# Patient Record
Sex: Male | Born: 1990 | Race: White | Hispanic: No | Marital: Married | Smoking: Never smoker
Health system: Southern US, Community
[De-identification: ages and names within clinical notes are randomized; demographics above are authoritative.]

## PROBLEM LIST (undated history)

## (undated) HISTORY — PX: ORIF ANKLE FRACTURE: SHX5408

## (undated) HISTORY — PX: FACIAL FRACTURE SURGERY: SHX1570

## (undated) HISTORY — PX: TIBIA FRACTURE SURGERY: SHX806

---
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
## 2010-08-27 IMAGING — CR RIGHT ANKLE - COMPLETE 3+ VIEW
1 series · 5 of 5 positions shown · non-contrast
Comparison: none

REASON FOR EXAM: pain and injury
COMMENTS:   May transport without cardiac monitor

[Series 1: view not recorded · 0.17mm/px · 5 of 5 slices shown]
[im 1/5]
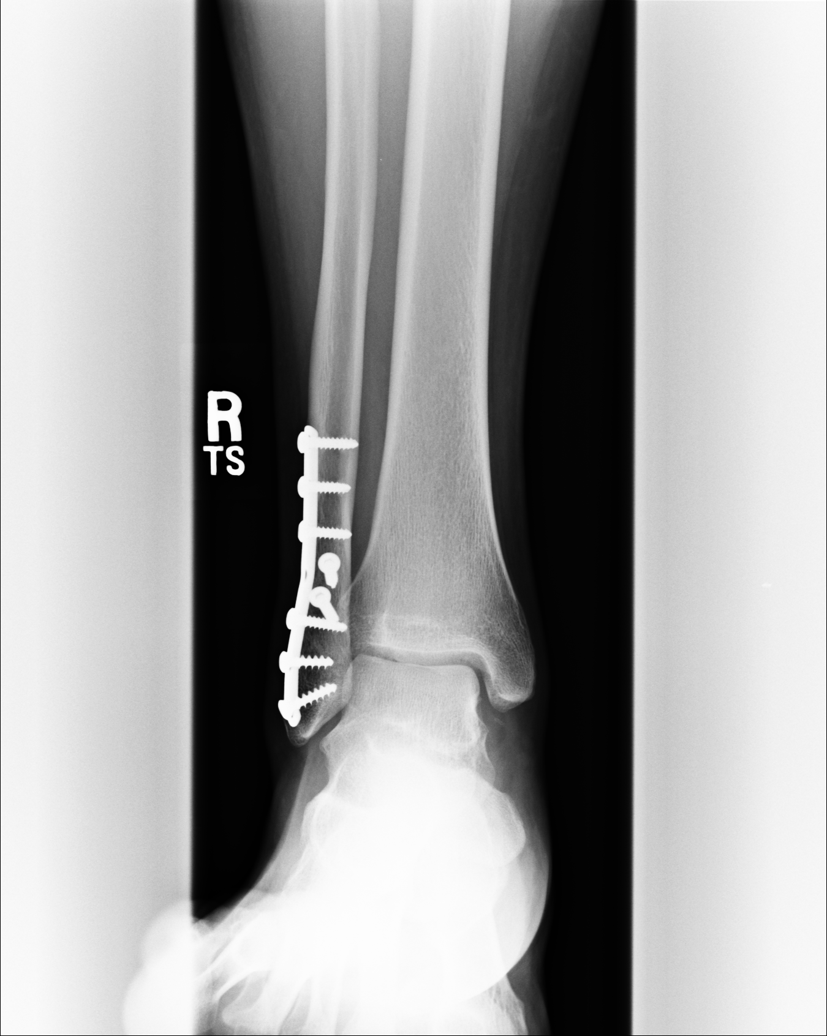
[im 2/5]
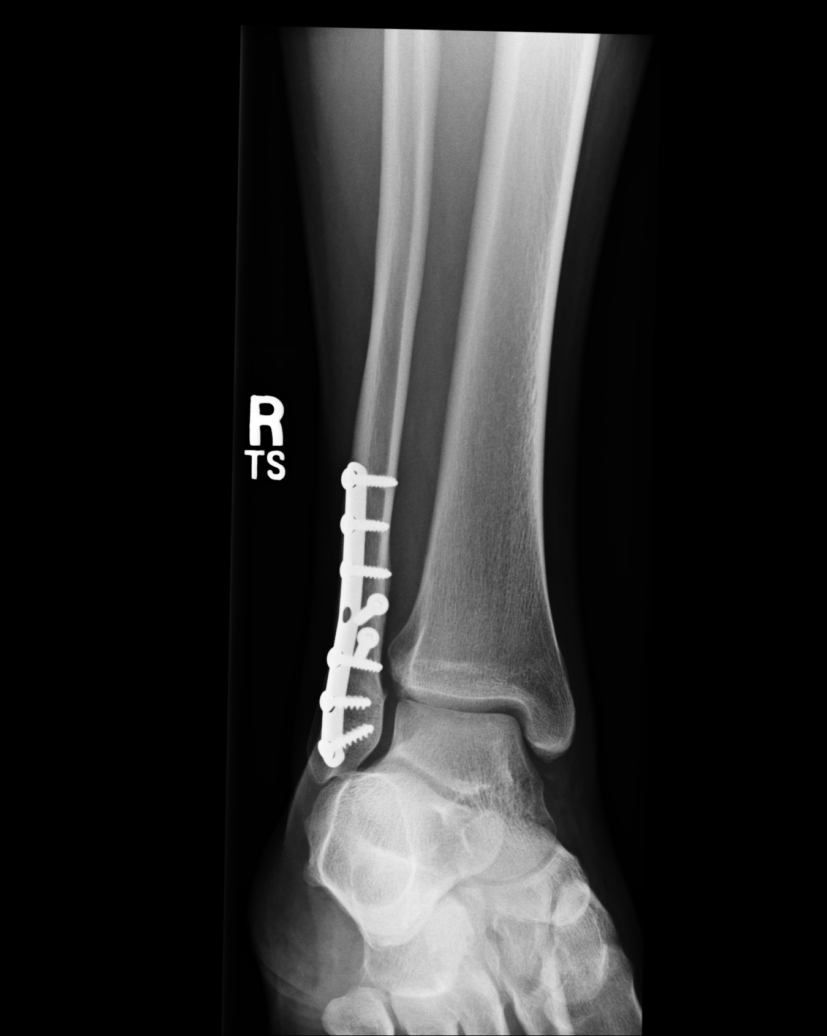
[im 3/5]
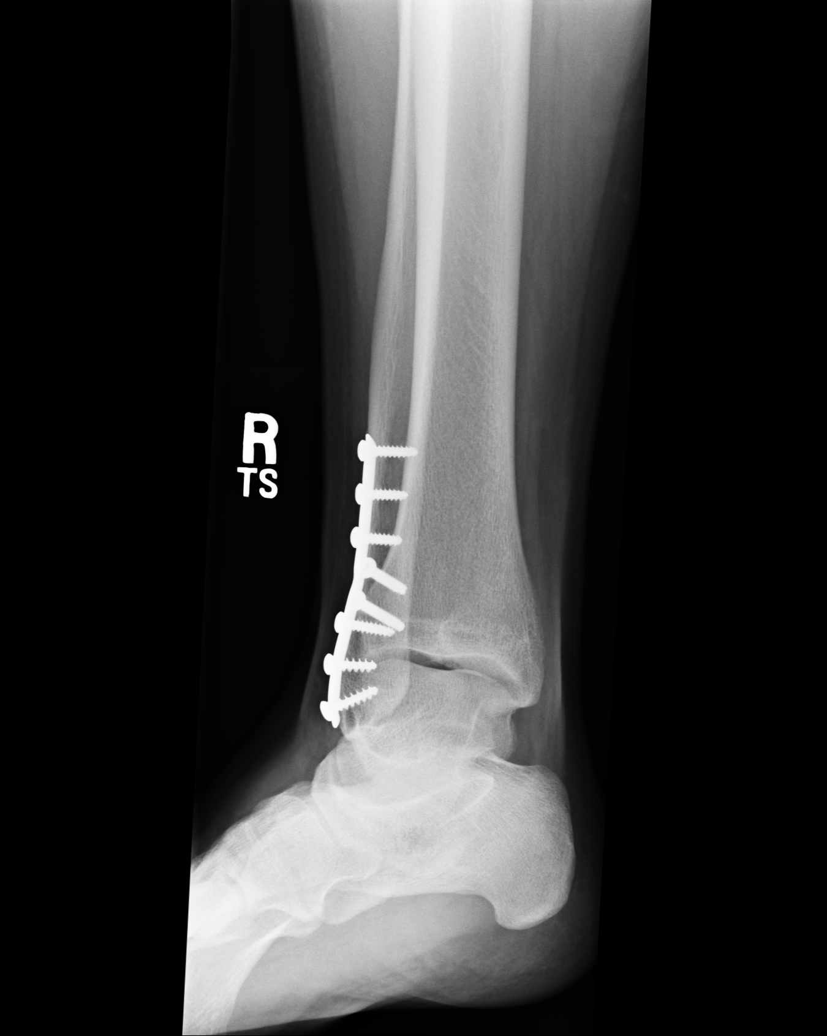
[im 4/5]
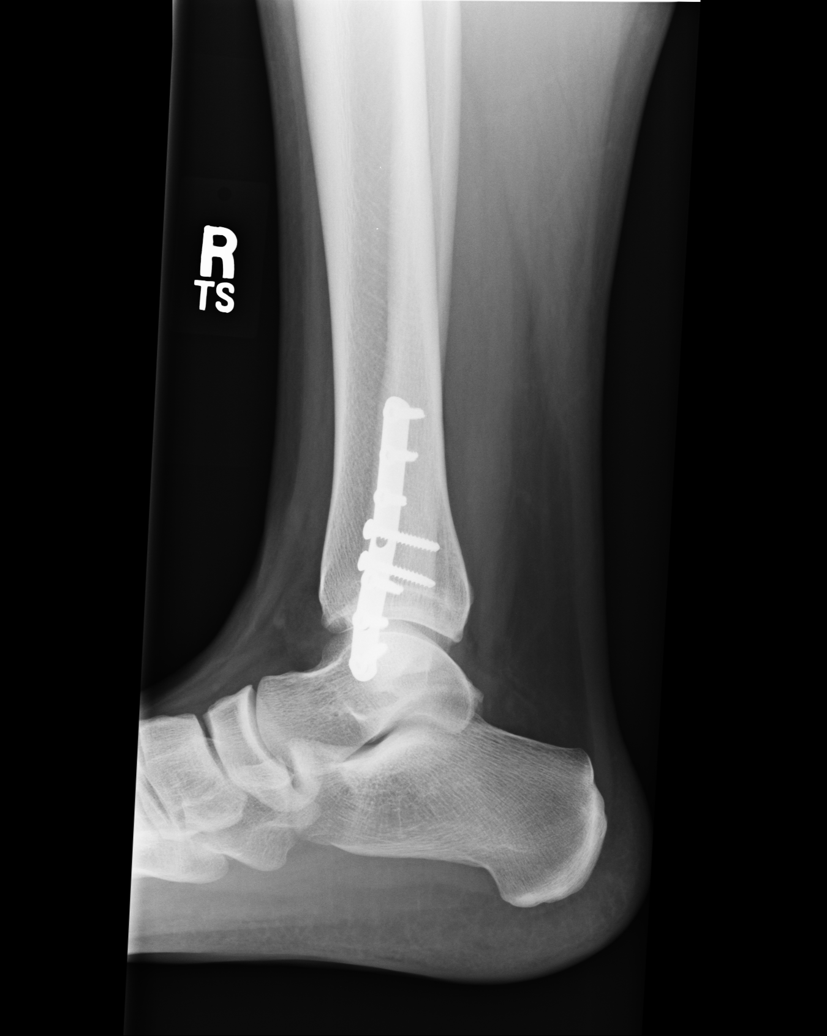
[im 5/5]
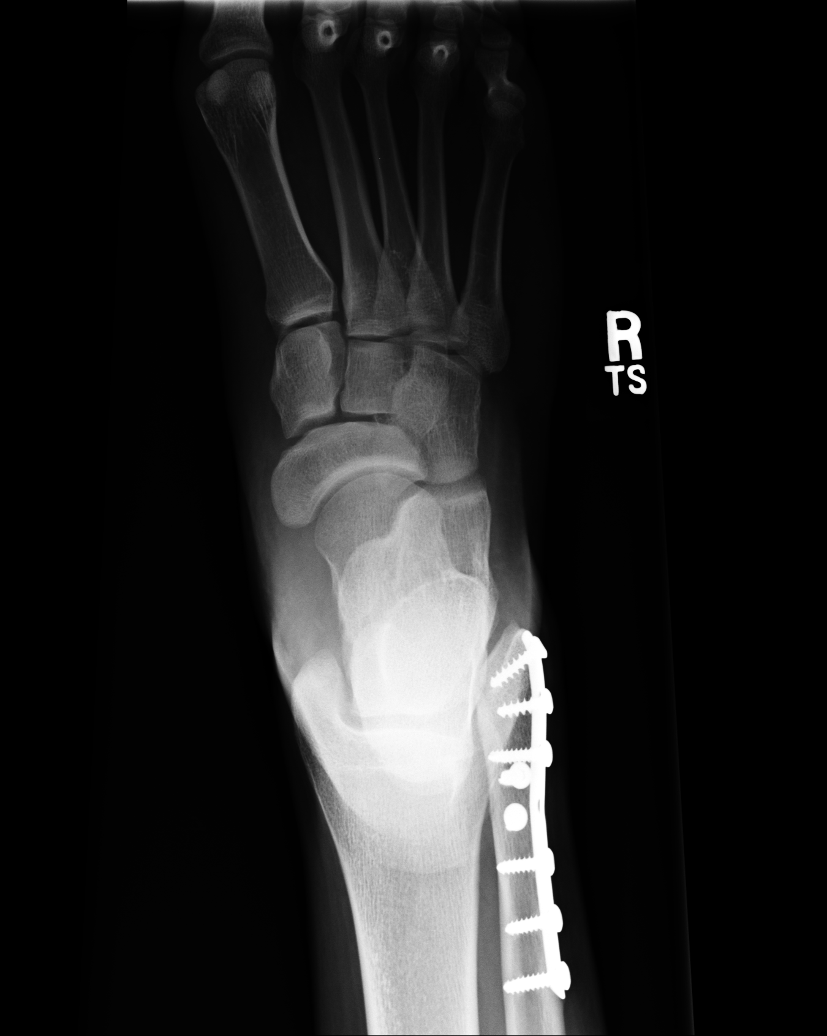

[5 of 5 positions shown; findings below may reference images not displayed]

PROCEDURE:     DXR - DXR ANKLE RIGHT COMPLETE  - June 20, 2009  [DATE]

RESULT:     There is a side plate with multiple screws in the distal right
fibula. Two screws are present anterior to posterior in the distal portion
of the fibula. There is no acute bone or hardware complication evident. The
plate and surgical screws appear intact. No fracture or dislocation is
evident.
IMPRESSION: Please see above.

## 2010-09-02 ENCOUNTER — Inpatient Hospital Stay: Payer: Self-pay | Admitting: Psychiatry

## 2014-04-02 ENCOUNTER — Emergency Department: Payer: Self-pay | Admitting: Emergency Medicine

## 2014-04-02 ENCOUNTER — Emergency Department: Payer: Self-pay | Admitting: Student

## 2014-04-04 ENCOUNTER — Emergency Department: Payer: Self-pay | Admitting: Emergency Medicine

## 2014-04-06 ENCOUNTER — Emergency Department: Payer: Self-pay | Admitting: Emergency Medicine

## 2015-12-22 ENCOUNTER — Emergency Department
Admission: EM | Admit: 2015-12-22 | Discharge: 2015-12-23 | Disposition: A | Discharge status: Home / Self Care | Payer: Self-pay | Attending: Emergency Medicine | Admitting: Emergency Medicine

## 2015-12-22 ENCOUNTER — Emergency Department: Payer: Self-pay

## 2015-12-22 DIAGNOSIS — Y999 Unspecified external cause status: Secondary | ICD-10-CM | POA: Insufficient documentation

## 2015-12-22 DIAGNOSIS — Y9389 Activity, other specified: Secondary | ICD-10-CM | POA: Insufficient documentation

## 2015-12-22 DIAGNOSIS — F17200 Nicotine dependence, unspecified, uncomplicated: Secondary | ICD-10-CM | POA: Insufficient documentation

## 2015-12-22 DIAGNOSIS — Z8781 Personal history of (healed) traumatic fracture: Secondary | ICD-10-CM | POA: Insufficient documentation

## 2015-12-22 DIAGNOSIS — Y92009 Unspecified place in unspecified non-institutional (private) residence as the place of occurrence of the external cause: Secondary | ICD-10-CM | POA: Insufficient documentation

## 2015-12-22 DIAGNOSIS — X500XXA Overexertion from strenuous movement or load, initial encounter: Secondary | ICD-10-CM | POA: Insufficient documentation

## 2015-12-22 DIAGNOSIS — M79604 Pain in right leg: Secondary | ICD-10-CM

## 2015-12-22 DIAGNOSIS — M79661 Pain in right lower leg: Secondary | ICD-10-CM

## 2015-12-22 DIAGNOSIS — M7989 Other specified soft tissue disorders: Secondary | ICD-10-CM | POA: Insufficient documentation
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
## 2015-12-22 NOTE — ED Notes (Signed)
Pulses doppled by nurse in triage
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
## 2015-12-23 ENCOUNTER — Encounter: Payer: Self-pay | Admitting: Emergency Medicine

## 2015-12-23 DIAGNOSIS — Y929 Unspecified place or not applicable: Secondary | ICD-10-CM | POA: Insufficient documentation

## 2015-12-23 DIAGNOSIS — Z8781 Personal history of (healed) traumatic fracture: Secondary | ICD-10-CM | POA: Insufficient documentation

## 2015-12-23 DIAGNOSIS — X501XXA Overexertion from prolonged static or awkward postures, initial encounter: Secondary | ICD-10-CM | POA: Insufficient documentation

## 2015-12-23 DIAGNOSIS — Y999 Unspecified external cause status: Secondary | ICD-10-CM | POA: Insufficient documentation

## 2015-12-23 DIAGNOSIS — R202 Paresthesia of skin: Secondary | ICD-10-CM | POA: Insufficient documentation

## 2015-12-23 DIAGNOSIS — F17290 Nicotine dependence, other tobacco product, uncomplicated: Secondary | ICD-10-CM | POA: Insufficient documentation

## 2015-12-23 DIAGNOSIS — Y9389 Activity, other specified: Secondary | ICD-10-CM | POA: Insufficient documentation

## 2015-12-23 DIAGNOSIS — Z79899 Other long term (current) drug therapy: Secondary | ICD-10-CM | POA: Insufficient documentation

## 2015-12-23 DIAGNOSIS — M79604 Pain in right leg: Secondary | ICD-10-CM | POA: Insufficient documentation

## 2015-12-23 LAB — CBC
HEMATOCRIT: 42.1 % (ref 40.0–52.0)
HEMOGLOBIN: 14.5 g/dL (ref 13.0–18.0)
MCH: 29.6 pg (ref 26.0–34.0)
MCHC: 34.5 g/dL (ref 32.0–36.0)
MCV: 85.8 fL (ref 80.0–100.0)
Platelets: 262 10*3/uL (ref 150–440)
RBC: 4.91 MIL/uL (ref 4.40–5.90)
RDW: 14 % (ref 11.5–14.5)
WBC: 10.2 10*3/uL (ref 3.8–10.6)

## 2015-12-23 LAB — BASIC METABOLIC PANEL
ANION GAP: 8 (ref 5–15)
BUN: 16 mg/dL (ref 6–20)
CHLORIDE: 104 mmol/L (ref 101–111)
CO2: 26 mmol/L (ref 22–32)
CREATININE: 1.13 mg/dL (ref 0.61–1.24)
Calcium: 9.4 mg/dL (ref 8.9–10.3)
GFR calc non Af Amer: >60 mL/min (ref >60)
Glucose, Bld: 104 mg/dL — ABNORMAL HIGH (ref 65–99)
POTASSIUM: 3.5 mmol/L (ref 3.5–5.1)
SODIUM: 138 mmol/L (ref 135–145)

## 2015-12-23 MED ORDER — ONDANSETRON HCL 4 MG/2ML IJ SOLN
4.0000 mg | Freq: Once | INTRAMUSCULAR | Status: AC
  Administered 2015-12-23: 4 mg via INTRAVENOUS
  Filled 2015-12-23: qty 2

## 2015-12-23 MED ORDER — MORPHINE SULFATE (PF) 4 MG/ML IV SOLN
4.0000 mg | Freq: Once | INTRAVENOUS | Status: AC
  Administered 2015-12-23: 4 mg via INTRAVENOUS
  Filled 2015-12-23: qty 1

## 2015-12-23 MED ORDER — OXYCODONE-ACETAMINOPHEN 5-325 MG PO TABS
1.0000 | ORAL_TABLET | Freq: Once | ORAL | Status: AC
  Administered 2015-12-23: 1 via ORAL
  Filled 2015-12-23: qty 1

## 2015-12-23 MED ORDER — OXYCODONE-ACETAMINOPHEN 5-325 MG PO TABS: 1.0000 | ORAL_TABLET | Freq: Four times a day (QID) | ORAL | 0 refills | Status: AC | PRN
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
## 2015-12-23 NOTE — ED Notes (Signed)
Pt. States he will call his PCP tomorrow.  Pt. Going home with crutches.

## 2015-12-23 NOTE — Discharge Instructions (Signed)
The fracture seen on your x-ray appears subacute. I will place she went a splint but it is imperative that she follow up with your orthopedic surgeon. If your pain worsens or he has any worsening numbness to your leg please also return to the hospital.

## 2015-12-24 ENCOUNTER — Emergency Department
Admission: EM | Admit: 2015-12-24 | Discharge: 2015-12-24 | Disposition: A | Discharge status: Home / Self Care | Payer: Self-pay | Attending: Emergency Medicine | Admitting: Emergency Medicine

## 2015-12-24 ENCOUNTER — Emergency Department: Payer: Self-pay

## 2015-12-24 DIAGNOSIS — M79604 Pain in right leg: Secondary | ICD-10-CM

## 2015-12-24 DIAGNOSIS — Z8781 Personal history of (healed) traumatic fracture: Secondary | ICD-10-CM

## 2015-12-24 DIAGNOSIS — R202 Paresthesia of skin: Secondary | ICD-10-CM

## 2015-12-24 MED ORDER — KETOROLAC TROMETHAMINE 30 MG/ML IJ SOLN
30.0000 mg | Freq: Once | INTRAMUSCULAR | Status: AC
  Administered 2015-12-24: 30 mg via INTRAVENOUS
  Filled 2015-12-24: qty 1

## 2015-12-24 MED ORDER — ONDANSETRON HCL 4 MG/2ML IJ SOLN
4.0000 mg | Freq: Once | INTRAMUSCULAR | Status: DC
Start: 2015-12-24 — End: 2015-12-24

## 2015-12-24 MED ORDER — MORPHINE SULFATE (PF) 4 MG/ML IV SOLN: 4.0000 mg | Freq: Once | INTRAVENOUS | Status: DC

## 2015-12-24 MED ORDER — MORPHINE SULFATE (PF) 4 MG/ML IV SOLN
4.0000 mg | Freq: Once | INTRAVENOUS | Status: AC
  Administered 2015-12-24: 4 mg via INTRAVENOUS
  Filled 2015-12-24: qty 1

## 2015-12-24 MED ORDER — ONDANSETRON HCL 4 MG/2ML IJ SOLN
4.0000 mg | Freq: Once | INTRAMUSCULAR | Status: AC
  Administered 2015-12-24: 4 mg via INTRAVENOUS
  Filled 2015-12-24: qty 2

## 2015-12-24 MED ORDER — IBUPROFEN 800 MG PO TABS: 800.0000 mg | ORAL_TABLET | Freq: Three times a day (TID) | ORAL | 0 refills | Status: AC | PRN
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
## 2015-12-24 NOTE — ED Notes (Signed)
Patient transported to Ultrasound 

## 2015-12-24 NOTE — Discharge Instructions (Addendum)
Please contact your orthopedic physician and follow up soon

## 2017-08-20 ENCOUNTER — Other Ambulatory Visit: Payer: Self-pay

## 2017-08-20 ENCOUNTER — Emergency Department: Payer: No Typology Code available for payment source

## 2017-08-20 ENCOUNTER — Encounter: Payer: Self-pay | Admitting: Emergency Medicine

## 2017-08-20 ENCOUNTER — Emergency Department
Admission: EM | Admit: 2017-08-20 | Discharge: 2017-08-20 | Disposition: A | Discharge status: Home / Self Care | Payer: No Typology Code available for payment source | Attending: Emergency Medicine | Admitting: Emergency Medicine

## 2017-08-20 DIAGNOSIS — Z79899 Other long term (current) drug therapy: Secondary | ICD-10-CM | POA: Diagnosis not present

## 2017-08-20 DIAGNOSIS — Y929 Unspecified place or not applicable: Secondary | ICD-10-CM | POA: Insufficient documentation

## 2017-08-20 DIAGNOSIS — M79604 Pain in right leg: Secondary | ICD-10-CM | POA: Insufficient documentation

## 2017-08-20 DIAGNOSIS — M25511 Pain in right shoulder: Secondary | ICD-10-CM | POA: Diagnosis not present

## 2017-08-20 DIAGNOSIS — M7918 Myalgia, other site: Secondary | ICD-10-CM

## 2017-08-20 DIAGNOSIS — Y939 Activity, unspecified: Secondary | ICD-10-CM | POA: Insufficient documentation

## 2017-08-20 DIAGNOSIS — Y999 Unspecified external cause status: Secondary | ICD-10-CM | POA: Insufficient documentation

## 2017-08-20 DIAGNOSIS — M79601 Pain in right arm: Secondary | ICD-10-CM | POA: Diagnosis not present

## 2017-08-20 MED ORDER — CYCLOBENZAPRINE HCL 10 MG PO TABS: 10.0000 mg | ORAL_TABLET | Freq: Three times a day (TID) | ORAL | 0 refills | Status: AC | PRN

## 2017-08-20 MED ORDER — IBUPROFEN 800 MG PO TABS
800.0000 mg | ORAL_TABLET | Freq: Once | ORAL | Status: AC
  Administered 2017-08-20: 800 mg via ORAL
  Filled 2017-08-20: qty 1

## 2017-08-20 MED ORDER — OXYCODONE-ACETAMINOPHEN 7.5-325 MG PO TABS: 1.0000 | ORAL_TABLET | Freq: Four times a day (QID) | ORAL | 0 refills | Status: AC | PRN

## 2017-08-20 MED ORDER — IBUPROFEN 600 MG PO TABS: 600.0000 mg | ORAL_TABLET | Freq: Three times a day (TID) | ORAL | 0 refills | Status: AC | PRN

## 2017-08-20 MED ORDER — CYCLOBENZAPRINE HCL 10 MG PO TABS
10.0000 mg | ORAL_TABLET | Freq: Once | ORAL | Status: AC
  Administered 2017-08-20: 10 mg via ORAL
  Filled 2017-08-20: qty 1

## 2017-08-20 MED ORDER — OXYCODONE-ACETAMINOPHEN 5-325 MG PO TABS
1.0000 | ORAL_TABLET | Freq: Once | ORAL | Status: AC
  Administered 2017-08-20: 1 via ORAL
  Filled 2017-08-20: qty 1
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
## 2017-08-20 NOTE — Discharge Instructions (Signed)
Follow discharge care instruction take medication as directed. °
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
## 2021-06-20 ENCOUNTER — Other Ambulatory Visit: Payer: Self-pay

## 2021-06-20 ENCOUNTER — Emergency Department
Admission: EM | Admit: 2021-06-20 | Discharge: 2021-06-20 | Disposition: A | Discharge status: Home / Self Care | Payer: Self-pay | Attending: Emergency Medicine | Admitting: Emergency Medicine

## 2021-06-20 DIAGNOSIS — L237 Allergic contact dermatitis due to plants, except food: Secondary | ICD-10-CM | POA: Insufficient documentation

## 2021-06-20 MED ORDER — PREDNISONE 20 MG PO TABS
60.0000 mg | ORAL_TABLET | Freq: Once | ORAL | Status: AC
  Administered 2021-06-20: 60 mg via ORAL
  Filled 2021-06-20: qty 3

## 2021-06-20 MED ORDER — PREDNISONE 10 MG (21) PO TBPK: ORAL_TABLET | ORAL | 0 refills | Status: AC

## 2021-06-20 NOTE — ED Provider Notes (Signed)
[facility] Provider Note  Patient Contact: 8:00 PM (approximate)   History   Allergic Reaction   HPI  Derek Cole is a 31 y.o. male presents to the emergency department with concern for worsening poison oak dermatitis.  Patient has rash of face, upper extremities and trunk.  He has had no associated shortness of breath, chest tightness, vomiting or diarrhea.  He has been using over-the-counter medications at home with little relief.      Physical Exam   Triage Vital Signs: ED Triage Vitals  Enc Vitals Group     BP 06/20/21 1905 (!) 135/94     Pulse Rate 06/20/21 1905 (!) 106     Resp 06/20/21 1905 18     Temp 06/20/21 1905 98.2 F (36.8 C)     Temp Source 06/20/21 1905 Oral     SpO2 06/20/21 1905 100 %     Weight 06/20/21 1906 248 lb (112.5 kg)     Height 06/20/21 1906 5\' 10"  (1.778 m)     Head Circumference --      Peak Flow --      Pain Score 06/20/21 1906 0     Pain Loc --      Pain Edu? --      Excl. in GC? --     Most recent vital signs: Vitals:   06/20/21 1905  BP: (!) 135/94  Pulse: (!) 106  Resp: 18  Temp: 98.2 F (36.8 C)  SpO2: 100%     General: Alert and in no acute distress. Eyes:  PERRL. EOMI. Head: No acute traumatic findings ENT:      Nose: No congestion/rhinnorhea.      Mouth/Throat: Mucous membranes are moist.  Neck: No stridor. No cervical spine tenderness to palpation. Cardiovascular:  Good peripheral perfusion Respiratory: Normal respiratory effort without tachypnea or retractions. Lungs CTAB. Good air entry to the bases with no decreased or absent breath sounds. Gastrointestinal: Bowel sounds 4 quadrants. Soft and nontender to palpation. No guarding or rigidity. No palpable masses. No distention. No CVA tenderness. Musculoskeletal: Full range of motion to all extremities.  Neurologic:  No gross focal neurologic deficits are appreciated.  Skin: Patient has diffuse, erythematous rash of face, upper  extremities and trunk.  Patient has linear component to rash with small vesicles.    ED Results / Procedures / Treatments   Labs (all labs ordered are listed, but only abnormal results are displayed) Labs Reviewed - No data to display    PROCEDURES:  Critical Care performed: No  Procedures   MEDICATIONS ORDERED IN ED: Medications  predniSONE (DELTASONE) tablet 60 mg (60 mg Oral Given 06/20/21 1930)     IMPRESSION / MDM / ASSESSMENT AND PLAN / ED COURSE  I reviewed the triage vital signs and the nursing notes.                              Assessment and plan Rash 31 year old male presents to the emergency department with a rash of the upper and lower extremities and trunk concerning for poison ivy dermatitis.  Given severity of rash, will initiate prednisone taper.  Patient was cautioned that if he stops prednisone prematurely, poison ivy dermatitis will rebound.  Return precautions were given to return with new or worsening symptoms.  All patient questions were answered.  FINAL CLINICAL IMPRESSION(S) / ED DIAGNOSES   Final diagnoses:  Poison ivy dermatitis  Rx / DC Orders   ED Discharge Orders          Ordered    predniSONE (STERAPRED UNI-PAK 21 TAB) 10 MG (21) TBPK tablet        06/20/21 1932             Note:  This document was prepared using Dragon voice recognition software and may include unintentional dictation errors.   Gasper Lloyd 06/20/21 Ova Freshwater, MD 06/20/21 2351

## 2021-06-20 NOTE — Discharge Instructions (Addendum)
Take tapered steroid as directed. If taper is stopped prematurely, poison ivy dermatitis will rebound.

## 2021-06-20 NOTE — ED Notes (Signed)
See triage note. Pt states he last took benadryl 50mg  at approx 7am this morning. Pt A&Ox4, NAD

## 2021-06-20 NOTE — ED Triage Notes (Signed)
Pt presents to ER d/t possible allergic rxn from dealing with poison oak/ivy on Monday while working.  Pt states he has been taking benadryl at home for the rash that has been spreading across his body without relief.  Pt denies any oral swelling.  Pt is A&O x4 at this time in NAD.  Redness noted across arms, face, upper and lower body.
